# Patient Record
Sex: Female | Born: 1970 | Race: White | Hispanic: No | Marital: Married | Smoking: Never smoker
Health system: Southern US, Community
[De-identification: ages and names within clinical notes are randomized; demographics above are authoritative.]

## PROBLEM LIST (undated history)

## (undated) DIAGNOSIS — G43909 Migraine, unspecified, not intractable, without status migrainosus: Secondary | ICD-10-CM

## (undated) DIAGNOSIS — F419 Anxiety disorder, unspecified: Secondary | ICD-10-CM

## (undated) DIAGNOSIS — E039 Hypothyroidism, unspecified: Secondary | ICD-10-CM

## (undated) DIAGNOSIS — F988 Other specified behavioral and emotional disorders with onset usually occurring in childhood and adolescence: Secondary | ICD-10-CM

## (undated) DIAGNOSIS — J45909 Unspecified asthma, uncomplicated: Secondary | ICD-10-CM

## (undated) HISTORY — DX: Hypothyroidism, unspecified: E03.9

## (undated) HISTORY — DX: Other specified behavioral and emotional disorders with onset usually occurring in childhood and adolescence: F98.8

## (undated) HISTORY — DX: Anxiety disorder, unspecified: F41.9

## (undated) HISTORY — DX: Migraine, unspecified, not intractable, without status migrainosus: G43.909

## (undated) HISTORY — DX: Unspecified asthma, uncomplicated: J45.909

## (undated) HISTORY — PX: BREAST CYST ASPIRATION: SHX578

---

## 2000-07-05 ENCOUNTER — Other Ambulatory Visit: Admission: RE | Admit: 2000-07-05 | Discharge: 2000-07-05 | Payer: Self-pay | Admitting: Obstetrics and Gynecology

## 2000-07-29 ENCOUNTER — Emergency Department (HOSPITAL_COMMUNITY): Admission: EM | Admit: 2000-07-29 | Discharge: 2000-07-30 | Payer: Self-pay | Admitting: Emergency Medicine

## 2000-07-30 ENCOUNTER — Encounter: Payer: Self-pay | Admitting: Emergency Medicine

## 2000-07-30 ENCOUNTER — Ambulatory Visit (HOSPITAL_COMMUNITY): Admission: RE | Admit: 2000-07-30 | Discharge: 2000-07-30 | Payer: Self-pay | Admitting: Emergency Medicine

## 2001-07-24 ENCOUNTER — Inpatient Hospital Stay (HOSPITAL_COMMUNITY): Admission: AD | Admit: 2001-07-24 | Discharge: 2001-07-24 | Payer: Self-pay | Admitting: Obstetrics and Gynecology

## 2001-07-24 ENCOUNTER — Encounter: Payer: Self-pay | Admitting: Obstetrics and Gynecology

## 2001-07-28 ENCOUNTER — Inpatient Hospital Stay (HOSPITAL_COMMUNITY): Admission: AD | Admit: 2001-07-28 | Discharge: 2001-07-28 | Payer: Self-pay | Admitting: Obstetrics and Gynecology

## 2001-07-29 ENCOUNTER — Inpatient Hospital Stay (HOSPITAL_COMMUNITY): Admission: AD | Admit: 2001-07-29 | Discharge: 2001-07-31 | Payer: Self-pay | Admitting: Obstetrics and Gynecology

## 2001-09-03 ENCOUNTER — Other Ambulatory Visit: Admission: RE | Admit: 2001-09-03 | Discharge: 2001-09-03 | Payer: Self-pay | Admitting: Obstetrics and Gynecology

## 2002-09-27 ENCOUNTER — Emergency Department (HOSPITAL_COMMUNITY): Admission: EM | Admit: 2002-09-27 | Discharge: 2002-09-27 | Payer: Self-pay | Admitting: Emergency Medicine

## 2002-10-04 ENCOUNTER — Other Ambulatory Visit: Admission: RE | Admit: 2002-10-04 | Discharge: 2002-10-04 | Payer: Self-pay | Admitting: Obstetrics and Gynecology

## 2003-06-06 ENCOUNTER — Encounter: Admission: RE | Admit: 2003-06-06 | Discharge: 2003-06-06 | Payer: Self-pay | Admitting: Internal Medicine

## 2003-06-06 ENCOUNTER — Encounter: Payer: Self-pay | Admitting: Internal Medicine

## 2003-10-10 ENCOUNTER — Other Ambulatory Visit: Admission: RE | Admit: 2003-10-10 | Discharge: 2003-10-10 | Payer: Self-pay | Admitting: Obstetrics and Gynecology

## 2004-04-05 ENCOUNTER — Emergency Department (HOSPITAL_COMMUNITY): Admission: EM | Admit: 2004-04-05 | Discharge: 2004-04-05 | Payer: Self-pay | Admitting: Family Medicine

## 2004-11-05 ENCOUNTER — Other Ambulatory Visit: Admission: RE | Admit: 2004-11-05 | Discharge: 2004-11-05 | Payer: Self-pay | Admitting: Obstetrics and Gynecology

## 2005-12-23 ENCOUNTER — Other Ambulatory Visit: Admission: RE | Admit: 2005-12-23 | Discharge: 2005-12-23 | Payer: Self-pay | Admitting: Obstetrics and Gynecology

## 2006-01-27 ENCOUNTER — Encounter: Admission: RE | Admit: 2006-01-27 | Discharge: 2006-01-27 | Payer: Self-pay | Admitting: Family Medicine

## 2012-08-03 ENCOUNTER — Other Ambulatory Visit: Payer: Self-pay | Admitting: Obstetrics and Gynecology

## 2012-08-03 DIAGNOSIS — N6009 Solitary cyst of unspecified breast: Secondary | ICD-10-CM

## 2012-08-10 ENCOUNTER — Ambulatory Visit
Admission: RE | Admit: 2012-08-10 | Discharge: 2012-08-10 | Disposition: A | Discharge status: Home / Self Care | Payer: BC Managed Care – PPO | Source: Ambulatory Visit | Attending: Obstetrics and Gynecology | Admitting: Obstetrics and Gynecology

## 2012-08-10 DIAGNOSIS — N6009 Solitary cyst of unspecified breast: Secondary | ICD-10-CM

## 2013-12-10 ENCOUNTER — Other Ambulatory Visit: Payer: Self-pay

## 2013-12-10 DIAGNOSIS — Z1231 Encounter for screening mammogram for malignant neoplasm of breast: Secondary | ICD-10-CM

## 2013-12-17 ENCOUNTER — Ambulatory Visit
Admission: RE | Admit: 2013-12-17 | Discharge: 2013-12-17 | Disposition: A | Discharge status: Home / Self Care | Payer: BC Managed Care – PPO | Source: Ambulatory Visit

## 2013-12-17 DIAGNOSIS — Z1231 Encounter for screening mammogram for malignant neoplasm of breast: Secondary | ICD-10-CM

## 2016-03-25 ENCOUNTER — Ambulatory Visit
Admission: RE | Admit: 2016-03-25 | Discharge: 2016-03-25 | Disposition: A | Discharge status: Home / Self Care | Payer: Self-pay | Source: Ambulatory Visit | Attending: Family Medicine | Admitting: Family Medicine

## 2016-03-25 ENCOUNTER — Other Ambulatory Visit: Payer: Self-pay | Admitting: Family Medicine

## 2016-03-25 DIAGNOSIS — J45909 Unspecified asthma, uncomplicated: Secondary | ICD-10-CM

## 2016-08-02 ENCOUNTER — Other Ambulatory Visit: Payer: Self-pay | Admitting: Family Medicine

## 2016-08-02 DIAGNOSIS — E049 Nontoxic goiter, unspecified: Secondary | ICD-10-CM

## 2016-08-08 ENCOUNTER — Ambulatory Visit
Admission: RE | Admit: 2016-08-08 | Discharge: 2016-08-08 | Disposition: A | Discharge status: Home / Self Care | Payer: 59 | Source: Ambulatory Visit | Attending: Family Medicine | Admitting: Family Medicine

## 2016-08-08 DIAGNOSIS — E049 Nontoxic goiter, unspecified: Secondary | ICD-10-CM

## 2016-08-09 ENCOUNTER — Other Ambulatory Visit: Payer: BLUE CROSS/BLUE SHIELD

## 2018-11-15 ENCOUNTER — Other Ambulatory Visit: Payer: Self-pay | Admitting: Family Medicine

## 2018-11-15 ENCOUNTER — Ambulatory Visit (INDEPENDENT_AMBULATORY_CARE_PROVIDER_SITE_OTHER): Payer: 59

## 2018-11-15 DIAGNOSIS — R002 Palpitations: Secondary | ICD-10-CM

## 2018-12-14 ENCOUNTER — Ambulatory Visit: Payer: 59 | Admitting: Internal Medicine

## 2019-01-15 ENCOUNTER — Telehealth: Payer: Self-pay | Admitting: Internal Medicine

## 2019-01-15 NOTE — Telephone Encounter (Signed)
Mychart, smartphone, pre reg complete 01/15/19 AF °

## 2019-01-16 ENCOUNTER — Telehealth (INDEPENDENT_AMBULATORY_CARE_PROVIDER_SITE_OTHER): Payer: 59 | Admitting: Internal Medicine

## 2019-01-16 ENCOUNTER — Ambulatory Visit: Payer: 59 | Admitting: Internal Medicine

## 2019-01-16 ENCOUNTER — Encounter: Payer: Self-pay | Admitting: Internal Medicine

## 2019-01-16 ENCOUNTER — Telehealth: Payer: Self-pay

## 2019-01-16 VITALS — BP 115/86 | HR 89 | Ht 66.0 in | Wt 232.0 lb

## 2019-01-16 DIAGNOSIS — F988 Other specified behavioral and emotional disorders with onset usually occurring in childhood and adolescence: Secondary | ICD-10-CM | POA: Insufficient documentation

## 2019-01-16 DIAGNOSIS — E039 Hypothyroidism, unspecified: Secondary | ICD-10-CM | POA: Insufficient documentation

## 2019-01-16 DIAGNOSIS — J45909 Unspecified asthma, uncomplicated: Secondary | ICD-10-CM

## 2019-01-16 DIAGNOSIS — R002 Palpitations: Secondary | ICD-10-CM | POA: Diagnosis not present

## 2019-01-16 DIAGNOSIS — I491 Atrial premature depolarization: Secondary | ICD-10-CM

## 2019-01-16 DIAGNOSIS — F419 Anxiety disorder, unspecified: Secondary | ICD-10-CM | POA: Insufficient documentation

## 2019-01-16 DIAGNOSIS — G43909 Migraine, unspecified, not intractable, without status migrainosus: Secondary | ICD-10-CM | POA: Insufficient documentation

## 2019-01-16 MED ORDER — DILTIAZEM HCL ER COATED BEADS 120 MG PO CP24: 120.0000 mg | ORAL_CAPSULE | Freq: Every day | ORAL | 1 refills | Status: DC

## 2019-01-16 NOTE — Telephone Encounter (Signed)
Virtual Visit Pre-Appointment Phone Call  "(Name), I am calling you today to discuss your upcoming appointment. We are currently trying to limit exposure to the virus that causes COVID-19 by seeing patients at home rather than in the office."  1. "What is the BEST phone number to call the day of the visit?" - include this in appointment notes  2. "Do you have or have access to (through a family member/friend) a smartphone with video capability that we can use for your visit?" a. If yes - list this number in appt notes as "cell" (if different from BEST phone #) and list the appointment type as a VIDEO visit in appointment notes b. If no - list the appointment type as a PHONE visit in appointment notes  3. Confirm consent - "In the setting of the current Covid19 crisis, you are scheduled for a VIDEO visit with Dr. Jacques NavyAcharya on 01/16/2019 at 1:20PM.  Just as we do with many in-office visits, in order for you to participate in this visit, we must obtain consent.  If you'd like, I can send this to your mychart (if signed up) or email for you to review.  Otherwise, I can obtain your verbal consent now.  All virtual visits are billed to your insurance company just like a normal visit would be.  By agreeing to a virtual visit, we'd like you to understand that the technology does not allow for your provider to perform an examination, and thus may limit your provider's ability to fully assess your condition. If your provider identifies any concerns that need to be evaluated in person, we will make arrangements to do so.  Finally, though the technology is pretty good, we cannot assure that it will always work on either your or our end, and in the setting of a video visit, we may have to convert it to a phone-only visit.  In either situation, we cannot ensure that we have a secure connection.  Are you willing to proceed?" STAFF: Did the patient verbally acknowledge consent to telehealth visit? Document YES/NO here:  YES  4. Advise patient to be prepared - "Two hours prior to your appointment, go ahead and check your blood pressure, pulse, oxygen saturation, and your weight (if you have the equipment to check those) and write them all down. When your visit starts, your provider will ask you for this information. If you have an Apple Watch or Kardia device, please plan to have heart rate information ready on the day of your appointment. Please have a pen and paper handy nearby the day of the visit as well."  5. Give patient instructions for MyChart download to smartphone OR Doximity/Doxy.me as below if video visit (depending on what platform provider is using)  6. Inform patient they will receive a phone call 15 minutes prior to their appointment time (may be from unknown caller ID) so they should be prepared to answer    TELEPHONE CALL NOTE  Lynnell B Dant has been deemed a candidate for a follow-up tele-health visit to limit community exposure during the Covid-19 pandemic. I spoke with the patient via phone to ensure availability of phone/video source, confirm preferred email & phone number, and discuss instructions and expectations.  I reminded Lottie RaterChristy B Fenner to be prepared with any vital sign and/or heart rhythm information that could potentially be obtained via home monitoring, at the time of her visit. I reminded Lottie RaterChristy B Noguera to expect a phone call prior to her visit.  Dorris Fetch, CMA 01/16/2019 2:33 PM   INSTRUCTIONS FOR DOWNLOADING THE MYCHART APP TO SMARTPHONE  - The patient must first make sure to have activated MyChart and know their login information - If Apple, go to [employer] and type in MyChart in the search bar and download the app. If Android, ask patient to go to [employer] and type in [facility] in the search bar and download the app. The app is free but as with any other app downloads, their phone may require them to verify saved payment information or Apple/Android  password.  - The patient will need to then log into the app with their MyChart username and password, and select [facility] as their healthcare provider to link the account. When it is time for your visit, go to the MyChart app, find appointments, and click Begin Video Visit. Be sure to Select Allow for your device to access the Microphone and Camera for your visit. You will then be connected, and your provider will be with you shortly.  **If they have any issues connecting, or need assistance please contact MyChart service desk [phone])**  **If using a computer, in order to ensure the best quality for their visit they will need to use either of the following Internet Browsers: [employer] or Google Chrome**  IF USING DOXIMITY or DOXY.ME - The patient will receive a link just prior to their visit by text.     FULL LENGTH CONSENT FOR TELE-HEALTH VISIT   I hereby voluntarily request, consent and authorize [facility] and its employed or contracted physicians, physician assistants, nurse practitioners or other licensed health care professionals (the Practitioner), to provide me with telemedicine health care services (the "Services") as deemed necessary by the treating Practitioner. I acknowledge and consent to receive the Services by the Practitioner via telemedicine. I understand that the telemedicine visit will involve communicating with the Practitioner through live audiovisual communication technology and the disclosure of certain medical information by electronic transmission. I acknowledge that I have been given the opportunity to request an in-person assessment or other available alternative prior to the telemedicine visit and am voluntarily participating in the telemedicine visit.  I understand that I have the right to withhold or withdraw my consent to the use of telemedicine in the course of my care at any time, without affecting my right to future care or treatment,  and that the Practitioner or I may terminate the telemedicine visit at any time. I understand that I have the right to inspect all information obtained and/or recorded in the course of the telemedicine visit and may receive copies of available information for a reasonable fee.  I understand that some of the potential risks of receiving the Services via telemedicine include:  Marland Kitchen Delay or interruption in medical evaluation due to technological equipment failure or disruption; . Information transmitted may not be sufficient (e.g. poor resolution of images) to allow for appropriate medical decision making by the Practitioner; and/or  . In rare instances, security protocols could fail, causing a breach of personal health information.  Furthermore, I acknowledge that it is my responsibility to provide information about my medical history, conditions and care that is complete and accurate to the best of my ability. I acknowledge that Practitioner's advice, recommendations, and/or decision may be based on factors not within their control, such as incomplete or inaccurate data provided by me or distortions of diagnostic images or specimens that may result from electronic transmissions. I understand that the  practice of medicine is not an Chief Strategy Officer and that Practitioner makes no warranties or guarantees regarding treatment outcomes. I acknowledge that I will receive a copy of this consent concurrently upon execution via email to the email address I last provided but may also request a printed copy by calling the office of [facility].    I understand that my insurance will be billed for this visit.   I have read or had this consent read to me. . I understand the contents of this consent, which adequately explains the benefits and risks of the Services being provided via telemedicine.  . I have been provided ample opportunity to ask questions regarding this consent and the Services and have had my questions  answered to my satisfaction. . I give my informed consent for the services to be provided through the use of telemedicine in my medical care  By participating in this telemedicine visit I agree to the above.

## 2019-01-16 NOTE — Progress Notes (Signed)
Virtual Visit via Video Note   This visit type was conducted due to national recommendations for restrictions regarding the COVID-19 Pandemic (e.g. social distancing) in an effort to limit this patient's exposure and mitigate transmission in our community.  Due to her co-morbid illnesses, this patient is at least at moderate risk for complications without adequate follow up.  This format is felt to be most appropriate for this patient at this time.  All issues noted in this document were discussed and addressed.  A limited physical exam was performed with this format.  Please refer to the patient's chart for her consent to telehealth for [facility].   Evaluation Performed:  Follow-up visit  Date:  01/16/2019   ID:  Renee Mays, DOB July 21, 1971, MRN [id number]  Patient Location: Home Provider Location: Home  PCP:  Maurice Small, MD  Cardiologist:  Parke Poisson, MD  Electrophysiologist:  None   Chief Complaint:  Palpitations  History of Present Illness:    Renee Mays is a 48 y.o. female with a past medical history of migraines, ADD, and asthma with recent worsening who presents today for evaluation of palpitations which have been documented as PACs.  She tells me that her symptoms started in February and feels like a racing heart.  She feels this sensation is different than her previously experienced panic attacks and feels more like a flipping in her chest that can occur without exertion.  She had an ECG performed which documented PACs and has also had a Holter monitor showing the same, however she tells me that she did not experience these concerning symptoms during her Holter monitor.  Since February she has avoided caffeine and has not used her albuterol inhaler this season, despite feeling as if her asthma symptoms are worse.  She feels that she needs to use her long-acting medications sooner than when they are due, and has noticed wheezing.  She does not follow with  an asthma specialist, and her asthma is primarily managed by Dr. Valentina Lucks.  She uses Advair and Singulair for asthma.  The day after she wore the Holter monitor she had an episode of palpitations for 5 or 6 hours.  Now she notices that her symptoms occur 3-4 times a week and can occur for 2 to 3 hours at a time, remitting on their own.  Reducing her caffeine intake has helped.  She continues to take Vyvanse mostly Monday through Friday to help with her focus at work.  We have talked in detail about how this may be exacerbating palpitations as a stimulant medication.  She takes the Vyvanse at 830 or 9 AM each day and notices her palpitations began at 2 PM and reliably occur in the afternoon.  The patient denies chest pain, chest pressure, PND, orthopnea, or leg swelling. Denies syncope or presyncope. Denies dizziness or lightheadedness. Denies snoring.  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  No known COVID exposures.  She has taken propranolol in the past for migraines and tolerated this medication well despite her history of asthma.  She is currently on Synthroid and has not had a recent dose change, last TSH was normal.  She stays active with walking and yard work and has no exertional chest pain or exertional palpitations. Her palpitations do make her fearful to exercise and use her rescue albuterol inhaler.  Hx of 1 pregnancy, no gestational DM or HTN, no pre-eclampsia.  No family history of SCD or early MI.   The patient does not have symptoms concerning for COVID-19 infection (fever, chills, cough, or new shortness of breath).    Past Medical History:  Diagnosis Date  . ADD (attention deficit disorder)   . Anxiety   . Asthma   . Hypothyroid   . Migraine    History reviewed. No pertinent surgical history.   Current Meds  Medication Sig  . albuterol (VENTOLIN HFA) 108 (90 Base) MCG/ACT inhaler Use as directed as  needed  . escitalopram (LEXAPRO) 10 MG tablet Take 10 mg by mouth daily.  . Fluticasone-Salmeterol (ADVAIR DISKUS) 100-50 MCG/DOSE AEPB Use as directed as needed for seasonal asthma  . levonorgestrel (MIRENA) 20 MCG/24HR IUD As directed  . levothyroxine (SYNTHROID) 50 MCG tablet TAKE 1 TAB 5 TIMES A WEEK, AND 2 TABS 2 TIMES A WEEK ON EMPTY STOMACH FOR THYROID.  Marland Kitchen. lisdexamfetamine (VYVANSE) 30 MG capsule Take as directed (mainly weekdays)  . montelukast (SINGULAIR) 10 MG tablet Take 1 tablet once a day as needed for allergies  . SUMAtriptan (IMITREX) 100 MG tablet Use as directed as needed for migraines     Allergies:   Sulfamethoxazole   Social History   Tobacco Use  . Smoking status: Never Smoker  . Smokeless tobacco: Never Used  Substance Use Topics  . Alcohol use: Not on file  . Drug use: Not on file     Family Hx: The patient's family history includes Atrial fibrillation in her father; Heart disease in her maternal grandfather.  ROS:   Please see the history of present illness.     All other systems reviewed and are negative.   Prior CV studies:   The following studies were reviewed today:  Holter - 11/15/2018 ECG - 10/27/2018  Labs/Other Tests and Data Reviewed:    EKG:  An ECG dated 10/27/2018 was personally reviewed today and demonstrated:  sinus rhythm, PACs, rate 80 bpm.  Recent Labs: No results found for requested labs within last 8760 hours.   Recent Lipid Panel No results found for: CHOL, TRIG, HDL, CHOLHDL, LDLCALC, LDLDIRECT  Wt Readings from Last 3 Encounters:  01/16/19 232 lb (105.2 kg)     Objective:    Vital Signs:  BP 115/86   Pulse 89   Ht 5\' 6"  (1.676 m)   Wt 232 lb (105.2 kg)   BMI 37.45 kg/m    VITAL SIGNS:  reviewed GEN:  no acute distress EYES:  sclerae anicteric, EOMI - Extraocular Movements Intact RESPIRATORY:  normal respiratory effort, symmetric expansion CARDIOVASCULAR:  no peripheral edema SKIN:  no rash, lesions or ulcers.  MUSCULOSKELETAL:  no obvious deformities. NEURO:  alert and oriented x 3, no obvious focal deficit PSYCH:  normal affect  ASSESSMENT & PLAN:    1. Palpitations   2. PAC (premature atrial contraction)   3. Anxiety   4. Asthma   5. Hypothyroidism, unspecified type    Her palpitations are occurring several times per week, but when captured appear to be PACs.  It would be reasonable to consider an extended monitor for her symptoms since she feels that they may not have been fully captured on the Holter monitor.  This is non-urgent and can be done in 6 to 8 weeks.  We will also plan for an echocardiogram around that time to ensure there is no structural cause of palpitations.  I have offered the patient medical therapy for treatment of palpitations symptoms.  With her subjective  worsening of asthma symptoms, I would like to avoid beta-blockade in the acute setting, though as this can be considered in the future if she has more stable sounding symptoms.  She has tolerated propranolol well in the past for migraines.  Instead we will attempt to use calcium channel blockers, and will start diltiazem long-acting 120 mg daily.  I have instructed the patient that this can be taken at anytime during the day that suits her symptoms.  I have instructed her to contact the office if she has lightheadedness, dizziness, presyncope or syncope and we will revisit her medical therapy.  I have reviewed this medication with our in office pharmacist, and this seems like a reasonable initiation dose.  She is to take her blood pressure and pulse daily and call us if there are any concerning aberrations.  COVID-19 Education: The signs and symptoms of COVID-19 were discussed with the patient and how to seek care for testing (follow up with PCP or arrange E-visit).  The importance of social distancing was discussed today.  Time:   Today, I have spent 33 minutes with the patient with telehealth technology discussing the above  problems.   Medication Adjustments/Labs and Tests Ordered: Current medicines are reviewed at length with the patient today.  Concerns regarding medicines are outlined above.   Tests Ordered: No orders of the defined types were placed in this encounter.   Medication Changes: Meds ordered this encounter  Medications  . diltiazem (CARDIZEM CD) 120 MG 24 hr capsule    Sig: Take 1 capsule (120 mg total) by mouth daily.    Dispense:  90 capsule    Refill:  1    Disposition:  Follow up in 2 month(s)  Signed, Parke Poisson, MD  01/16/2019 4:59 PM    [facility]  Medication Instructions:   START DILTIAZEM (Cardizem) 120 Mg daily  If you need a refill on your cardiac medications before your next appointment, please call your pharmacy.   Lab work:  NONE ordered at this time of appointment   If you have labs (blood work) drawn today and your tests are completely normal, you will receive your results only by: Marland Kitchen MyChart Message (if you have MyChart) OR . A paper copy in the mail If you have any lab test that is abnormal or we need to change your treatment, we will call you to review the results.  Testing/Procedures:  NONE ordered at this time of appointment   Follow-Up: At [facility], you and your health needs are our priority.  As part of our continuing mission to provide you with exceptional heart care, we have created designated Provider Care Teams.  These Care Teams include your primary Cardiologist (physician) and Advanced Practice Providers (APPs -  Physician Assistants and Nurse Practitioners) who all work together to provide you with the care you need, when you need it. . You will need a follow up appointment in 2 months with Parke Poisson, MD   Any Other Special Instructions Will Be Listed Below (If Applicable).   Monitor you bloop pressure (BP) and heart rate (HR) daily  If your the Systolic (top) number of your blood pressure is 100  mmHg or less and your heart rate at rest is 50 bpm or less please give our office a call  If you are experiencing any lightheadedness and/or dizziness please give our office a call

## 2019-01-16 NOTE — Patient Instructions (Addendum)
Medication Instructions:   START DILTIAZEM (Cardizem) 120 Mg daily  If you need a refill on your cardiac medications before your next appointment, please call your pharmacy.   Lab work:  NONE ordered at this time of appointment   If you have labs (blood work) drawn today and your tests are completely normal, you will receive your results only by: Marland Kitchen MyChart Message (if you have MyChart) OR . A paper copy in the mail If you have any lab test that is abnormal or we need to change your treatment, we will call you to review the results.  Testing/Procedures:  NONE ordered at this time of appointment   Follow-Up: At [facility], you and your health needs are our priority.  As part of our continuing mission to provide you with exceptional heart care, we have created designated Provider Care Teams.  These Care Teams include your primary Cardiologist (physician) and Advanced Practice Providers (APPs -  Physician Assistants and Nurse Practitioners) who all work together to provide you with the care you need, when you need it. . You will need a follow up appointment in 2 months with Parke Poisson, MD   Any Other Special Instructions Will Be Listed Below (If Applicable).   Monitor you bloop pressure (BP) and heart rate (HR) daily  If your the Systolic (top) number of your blood pressure is 100 or less and your heart rate at rest is 50 or less please give our office a call  If you are experiencing any lightheadedness and/or dizziness please give our office a call

## 2019-03-14 ENCOUNTER — Telehealth: Payer: Self-pay | Admitting: Internal Medicine

## 2019-03-14 NOTE — Telephone Encounter (Signed)
Mychart, smartphone, consent, pre reg complete 03/14/19 AF °

## 2019-03-20 ENCOUNTER — Telehealth (INDEPENDENT_AMBULATORY_CARE_PROVIDER_SITE_OTHER): Payer: 59 | Admitting: Internal Medicine

## 2019-03-20 VITALS — BP 112/78 | HR 88 | Ht 66.0 in | Wt 230.0 lb

## 2019-03-20 DIAGNOSIS — E039 Hypothyroidism, unspecified: Secondary | ICD-10-CM

## 2019-03-20 DIAGNOSIS — I491 Atrial premature depolarization: Secondary | ICD-10-CM | POA: Diagnosis not present

## 2019-03-20 DIAGNOSIS — J45909 Unspecified asthma, uncomplicated: Secondary | ICD-10-CM | POA: Diagnosis not present

## 2019-03-20 DIAGNOSIS — R002 Palpitations: Secondary | ICD-10-CM | POA: Diagnosis not present

## 2019-03-20 DIAGNOSIS — F419 Anxiety disorder, unspecified: Secondary | ICD-10-CM | POA: Diagnosis not present

## 2019-03-20 NOTE — Patient Instructions (Addendum)
Medication Instructions:  Continue diltiazem at current dose 120 mg daily.   If you need a refill on your cardiac medications before your next appointment, please call your pharmacy.   Lab work: NONE   Testing/Procedures: Your physician has requested that you have an echocardiogram. Echocardiography is a painless test that uses sound waves to create images of your heart. It provides your doctor with information about the size and shape of your heart and how well your heart's chambers and valves are working. This procedure takes approximately one hour. There are no restrictions for this procedure. [facility] AT [address]  Follow-Up: At [facility], you and your health needs are our priority.  As part of our continuing mission to provide you with exceptional heart care, we have created designated Provider Care Teams.  These Care Teams include your primary Cardiologist (physician) and Advanced Practice Providers (APPs -  Physician Assistants and Nurse Practitioners) who all work together to provide you with the care you need, when you need it. You will need a follow up appointment in 6 months.  Please call our office 2 months in advance to schedule this appointment.  You may see Parke PoissonGayatri A Briannon Boggio, MD or one of the following Advanced Practice Providers on your designated Care Team:   Corine ShelterLuke Kilroy, PA-C Judy PimpleKrista Kroeger, [place] JerseyPA-C . Marjie Skiffallie Goodrich, PA-C  Any Other Special Instructions Will Be Listed Below (If Applicable).  Echocardiogram An echocardiogram is a procedure that uses painless sound waves (ultrasound) to produce an image of the heart. Images from an echocardiogram can provide important information about:  Signs of coronary artery disease (CAD).  Aneurysm detection. An aneurysm is a weak or damaged part of an artery wall that bulges out from the normal force of blood pumping through the body.  Heart size and shape. Changes in the size or shape of the heart can be  associated with certain conditions, including heart failure, aneurysm, and CAD.  Heart muscle function.  Heart valve function.  Signs of a past heart attack.  Fluid buildup around the heart.  Thickening of the heart muscle.  A tumor or infectious growth around the heart valves. Tell a health care provider about:  Any allergies you have.  All medicines you are taking, including vitamins, herbs, eye drops, creams, and over-the-counter medicines.  Any blood disorders you have.  Any surgeries you have had.  Any medical conditions you have.  Whether you are pregnant or may be pregnant. What are the risks? Generally, this is a safe procedure. However, problems may occur, including:  Allergic reaction to dye (contrast) that may be used during the procedure. What happens before the procedure? No specific preparation is needed. You may eat and drink normally. What happens during the procedure?   An IV tube may be inserted into one of your veins.  You may receive contrast through this tube. A contrast is an injection that improves the quality of the pictures from your heart.  A gel will be applied to your chest.  A wand-like tool (transducer) will be moved over your chest. The gel will help to transmit the sound waves from the transducer.  The sound waves will harmlessly bounce off of your heart to allow the heart images to be captured in real-time motion. The images will be recorded on a computer. The procedure may vary among health care providers and hospitals. What happens after the procedure?  You may return to your normal, everyday life, including diet,  activities, and medicines, unless your health care provider tells you not to do that. Summary  An echocardiogram is a procedure that uses painless sound waves (ultrasound) to produce an image of the heart.  Images from an echocardiogram can provide important information about the size and shape of your heart, heart  muscle function, heart valve function, and fluid buildup around your heart.  You do not need to do anything to prepare before this procedure. You may eat and drink normally.  After the echocardiogram is completed, you may return to your normal, everyday life, unless your health care provider tells you not to do that. This information is not intended to replace advice given to you by your health care provider. Make sure you discuss any questions you have with your health care provider. Document Released: 09/09/2000 Document Revised: 10/15/2016 Document Reviewed: 10/15/2016 Elsevier Interactive Patient Education  2019 [employer].

## 2019-03-20 NOTE — Progress Notes (Signed)
Virtual Visit via Telephone Note   This visit type was conducted due to national recommendations for restrictions regarding the COVID-19 Pandemic (e.g. social distancing) in an effort to limit this patient's exposure and mitigate transmission in our community.  Due to her co-morbid illnesses, this patient is at least at moderate risk for complications without adequate follow up.  This format is felt to be most appropriate for this patient at this time.  The patient did not have access to video technology/had technical difficulties with video requiring transitioning to audio format only (telephone).  All issues noted in this document were discussed and addressed.  No physical exam could be performed with this format.  Please refer to the patient's chart for her  consent to telehealth for [facility].   Date:  03/20/2019   ID:  Renee Mays, DOB Feb 28, 1971, MRN [id number]  Patient Location: Home Provider Location: Office  PCP:  Maurice SmallGriffin, Elaine, MD  Cardiologist:  Parke PoissonGayatri A Erasmus Bistline, MD  Electrophysiologist:  None   Evaluation Performed:  Follow-Up Visit  Chief Complaint:  palpitations  History of Present Illness:    Renee RaterChristy B Mays is a 48 y.o. female with a past medical history of migraines, ADD, and asthma, who presents for follow up of palpitations.   She saw an allergist after our last visit, and switched to symbicort, had significant allergies.Asthma therapy is helping improve her symptoms. Palpitations 2 days a week now - better from almost daily before. Vyvanse does not seem to affect. Control of asthma symptoms has been most helpful.   The patient denies chest pain, chest pressure, PND, orthopnea, or leg swelling. Denies syncope or presyncope. Denies dizziness or lightheadedness.  Tolerating diltiazem well. No hypotension or bradycardia. Reviewed holter monitor which showed PACs.   The patient does not have symptoms concerning for COVID-19 infection (fever, chills, cough,  or new shortness of breath).    Past Medical History:  Diagnosis Date  . ADD (attention deficit disorder)   . Anxiety   . Asthma   . Hypothyroid   . Migraine    No past surgical history on file.   Current Meds  Medication Sig  . albuterol (VENTOLIN HFA) 108 (90 Base) MCG/ACT inhaler Use as directed as needed  . diltiazem (CARDIZEM CD) 120 MG 24 hr capsule Take 1 capsule (120 mg total) by mouth daily.  Marland Kitchen. escitalopram (LEXAPRO) 10 MG tablet Take 10 mg by mouth daily.  . Fluticasone-Salmeterol (ADVAIR DISKUS) 100-50 MCG/DOSE AEPB Use as directed as needed for seasonal asthma  . levonorgestrel (MIRENA) 20 MCG/24HR IUD As directed  . levothyroxine (SYNTHROID) 50 MCG tablet TAKE 1 TAB 5 TIMES A WEEK, AND 2 TABS 2 TIMES A WEEK ON EMPTY STOMACH FOR THYROID.  Marland Kitchen. lisdexamfetamine (VYVANSE) 30 MG capsule Take as directed (mainly weekdays)  . montelukast (SINGULAIR) 10 MG tablet Take 1 tablet once a day as needed for allergies  . SUMAtriptan (IMITREX) 100 MG tablet Use as directed as needed for migraines  . SYMBICORT 80-4.5 MCG/ACT inhaler INHALE 2 PFFS INTO THE LUNGS TWICE DAILY     Allergies:   Sulfamethoxazole   Social History   Tobacco Use  . Smoking status: Never Smoker  . Smokeless tobacco: Never Used  Substance Use Topics  . Alcohol use: Not on file  . Drug use: Not on file     Family Hx: The patient's family history includes Atrial fibrillation in her father; Heart disease in her maternal grandfather.  ROS:   Please see  the history of present illness.     All other systems reviewed and are negative.   Prior CV studies:   The following studies were reviewed today:    Labs/Other Tests and Data Reviewed:    EKG:  No ECG reviewed.  Recent Labs: No results found for requested labs within last 8760 hours.   Recent Lipid Panel No results found for: CHOL, TRIG, HDL, CHOLHDL, LDLCALC, LDLDIRECT  Wt Readings from Last 3 Encounters:  03/20/19 230 lb (104.3 kg)   01/16/19 232 lb (105.2 kg)     Objective:    Vital Signs:  BP 112/78   Pulse 88   Ht 5\' 6"  (1.676 m)   Wt 230 lb (104.3 kg)   BMI 37.12 kg/m    VITAL SIGNS:  reviewed GEN:  no acute distress  ASSESSMENT & PLAN:    1. Palpitations   2. PAC (premature atrial contraction)   3. Anxiety   4. Asthma   5. Hypothyroidism, unspecified type    Palpitations - doing well on diltiazem and adjusted asthma therapy. Will obtain an echocardiogram to ensure there are no structural changes to the heart contributing to palpitations which may not have been captured on 24 hr holter.   Asthma - per allergy.   Lifestyle modification and CV prevention - discussed. Recommendations below: Exercise recommendations: Goal of exercising for at least 30 minutes a day, at least 5 times per week.  Please exercise to a moderate exertion.  This means that while exercising it is difficult to speak in full sentences, however you are not so short of breath that you feel you must stop, and not so comfortable that you can carry on a full conversation.  Exertion level should be approximately a 5/10, if 10 is the most exertion you can perform.  Diet recommendations: Recommend a heart healthy diet such as the Mediterranean diet.  This diet consists of plant based foods, healthy fats, lean meats, olive oil.  It suggests limiting the intake of simple carbohydrates such as white breads, pastries, and pastas.  It also limits the amount of red meat, wine, and dairy products such as cheese that one should consume on a daily basis.   COVID-19 Education: The signs and symptoms of COVID-19 were discussed with the patient and how to seek care for testing (follow up with PCP or arrange E-visit).  The importance of social distancing was discussed today.  Time:   Today, I have spent 20 minutes with the patient with telehealth technology discussing the above problems.     Medication Adjustments/Labs and Tests Ordered: Current  medicines are reviewed at length with the patient today.  Concerns regarding medicines are outlined above.   Tests Ordered: Orders Placed This Encounter  Procedures  . ECHOCARDIOGRAM COMPLETE    Medication Changes: No orders of the defined types were placed in this encounter.   Follow Up:  Virtual Visit or In Person in 6 month(s)  Signed, Elouise Munroe, MD  03/20/2019 9:47 PM    [facility]

## 2019-04-01 ENCOUNTER — Other Ambulatory Visit (HOSPITAL_COMMUNITY): Payer: 59

## 2019-04-04 ENCOUNTER — Other Ambulatory Visit: Payer: Self-pay

## 2019-04-04 ENCOUNTER — Ambulatory Visit (HOSPITAL_COMMUNITY): Payer: 59 | Attending: Cardiovascular Disease

## 2019-04-04 DIAGNOSIS — R002 Palpitations: Secondary | ICD-10-CM | POA: Diagnosis present

## 2019-05-21 NOTE — Telephone Encounter (Signed)
Returned call to pt she states that she is having increased HR and PAC's multiple times daily and states that she does not take her BP but HR on her apple watch is 105-134. Scheduled appt 05-27-2019 for evaluation

## 2019-05-21 NOTE — Telephone Encounter (Signed)
Renee Mays - I agree with patient that she should come in for an in-office visit so we can check an EKG. I dont have any openings on my schedule tomorrow - can you check the other APP's schedules to see if anyone has an appointment this week or early next week? In the meantime, as long as her blood pressures have been >110/70, would favor increasing her diltiazem to 240mg  daily for better HR control.   Thanks!

## 2019-05-26 NOTE — Progress Notes (Signed)
Cardiology Office Note:   Date:  05/27/2019  NAME:  Renee Mays    MRN: [id number] DOB:  October 14, 1970   PCP:  Kelton Pillar, MD  Cardiologist:  Elouise Munroe, MD  Electrophysiologist:  None   Referring MD: Kelton Pillar, MD   Chief Complaint  Patient presents with  . Palpitations   History of Present Illness:   Renee Mays is a 48 y.o. female with a hx of asthma, anxiety, palpitations who is being seen today for the evaluation of palpitations at the request of Kelton Pillar, MD. she presents for evaluation of worsening palpitations, and shortness of breath with exertion.  She reports for the past 2 months she is noticed worsening sensation of tachycardia and shortness of breath with activity.  It appears this started out of the blue.  She reports no associated chest pain or pressure.  She was recently started on diltiazem in March for frequent PACs.  She reports she notices them less with Cardizem, and things were were better.  However it appears, she has developed new symptoms of worsening exertional tachycardia.  She monitors her heart rate and states that it can get up to the 140-150 range.  She reports this sensation of rapid heartbeat with exercise causes her significant stress.  She also reports a significant history of anxiety and stress.  She takes several medications for this.  She also does report a period of inactivity due to the recent coronavirus pandemic, and it appears her symptoms have also coincided with reinitiation of more strenuous activity.  Overall, she does report a lack of energy.  Of note, she did not take her diltiazem this morning and her heart rate is 108 EKG demonstrates sinus tachycardia.  She reports occasional lower extremity swelling, after being in the sun for several hours.  She also reports hot flashes and symptoms concerning for menopause, however GYN reports she has not there yet.   Past Medical History: Past Medical History:  Diagnosis Date   . ADD (attention deficit disorder)   . Anxiety   . Asthma   . Hypothyroid   . Migraine     Past Surgical History: No past surgical history on file.  Current Medications: Current Meds  Medication Sig  . albuterol (VENTOLIN HFA) 108 (90 Base) MCG/ACT inhaler Use as directed as needed  . diltiazem (CARDIZEM CD) 120 MG 24 hr capsule Take 1 capsule (120 mg total) by mouth daily.  Marland Kitchen escitalopram (LEXAPRO) 10 MG tablet Take 10 mg by mouth daily.  Marland Kitchen levonorgestrel (MIRENA) 20 MCG/24HR IUD As directed  . levothyroxine (SYNTHROID) 50 MCG tablet TAKE 1 TAB 5 TIMES A WEEK, AND 2 TABS 2 TIMES A WEEK ON EMPTY STOMACH FOR THYROID.  Marland Kitchen lisdexamfetamine (VYVANSE) 30 MG capsule Take as directed (mainly weekdays)  . montelukast (SINGULAIR) 10 MG tablet Take 1 tablet once a day as needed for allergies  . SUMAtriptan (IMITREX) 100 MG tablet Use as directed as needed for migraines  . SYMBICORT 80-4.5 MCG/ACT inhaler INHALE 2 PFFS INTO THE LUNGS TWICE DAILY     Allergies:    Sulfamethoxazole   Social History: Social History   Socioeconomic History  . Marital status: Married    Spouse name: Not on file  . Number of children: Not on file  . Years of education: Not on file  . Highest education level: Not on file  Occupational History  . Not on file  Social Needs  . Financial resource strain: Not on file  .  Food insecurity    Worry: Not on file    Inability: Not on file  . Transportation needs    Medical: Not on file    Non-medical: Not on file  Tobacco Use  . Smoking status: Never Smoker  . Smokeless tobacco: Never Used  Substance and Sexual Activity  . Alcohol use: Not on file  . Drug use: Not on file  . Sexual activity: Not on file  Lifestyle  . Physical activity    Days per week: Not on file    Minutes per session: Not on file  . Stress: Not on file  Relationships  . Social Musicianconnections    Talks on phone: Not on file    Gets together: Not on file    Attends religious service:  Not on file    Active member of club or organization: Not on file    Attends meetings of clubs or organizations: Not on file    Relationship status: Not on file  Other Topics Concern  . Not on file  Social History Narrative  . Not on file     Family History: The patient's family history includes Atrial fibrillation in her father; Heart disease in her maternal grandfather.  ROS:   All other ROS reviewed and negative. Pertinent positives noted in the HPI.     EKGs/Labs/Other Studies Reviewed:   The following studies were personally reviewed by me today: TSH 10/2018 1.92  Review of recent 24 monitor demonstrates occasional PACs  EKG:  EKG is ordered today.  The ekg ordered today demonstrates sinus tachycardia, heart rate 108, normal intervals, no acute ST-T changes, no prior infarction, and was personally reviewed by me.   Recent Labs: No results found for requested labs within last 8760 hours.   Recent Lipid Panel No results found for: CHOL, TRIG, HDL, CHOLHDL, VLDL, LDLCALC, LDLDIRECT  Physical Exam:   VS:  BP 102/70   Pulse (!) 113   Temp 98.2 F (36.8 C)   Ht 5\' 6"  (1.676 m)   Wt 246 lb (111.6 kg)   SpO2 99%   BMI 39.71 kg/m    Wt Readings from Last 3 Encounters:  05/27/19 246 lb (111.6 kg)  03/20/19 230 lb (104.3 kg)  01/16/19 232 lb (105.2 kg)    General: Well nourished, well developed, in no acute distress Heart: Atraumatic, normal size  Eyes: PEERLA, EOMI  Neck: Supple, no JVD Endocrine: No thryomegaly Cardiac: Normal S1, S2; RRR; no murmurs, rubs, or gallops Lungs: Clear to auscultation bilaterally, no wheezing, rhonchi or rales  Abd: Soft, nontender, no hepatomegaly  Ext: No edema, pulses 2+ Musculoskeletal: No deformities, BUE and BLE strength normal and equal Skin: Warm and dry, no rashes   Neuro: Alert and oriented to person, place, time, and situation, CNII-XII grossly intact, no focal deficits  Psych: Normal mood and affect   ASSESSMENT:   NAME@  is a 48 y.o. female who presents for the following: 1. Palpitations   2. PAC (premature atrial contraction)   3. Sinus tachycardia   4. Pre-procedure lab exam     PLAN:   1. Palpitations 2. PAC (premature atrial contraction) 3. Sinus tachycardia -She has a known history of symptomatic PACs and is taking Cardizem.  It appears recently she is developed exertional symptoms of palpitations and shortness of breath.  A recent echocardiogram demonstrates normal LV function and normal diastolic function.  It appears her symptoms are always related to heavy exertion.  EKG today demonstrates sinus tachycardia  with a rate of 108.  I think her symptoms could be related to deconditioning, and/or intermittent rebound tachycardia from her Cardizem.  I think the simplest approach to determine what is going on when she exercises is to do a plain exercise treadmill test.  She reports she is in agreement to take this test.  I think it will be beneficial determine what occurs when she does exert herself. -Most recent thyroid studies were normal -I have encouraged her to continue to exercise and exert herself as she is able to in the interim -She will need a coronavirus test prior to her exercise treadmill  4. Pre-procedure lab exam -[place] test prior to exercise test   Disposition: Return in about 3 months (around 08/26/2019).  Medication Adjustments/Labs and Tests Ordered: Current medicines are reviewed at length with the patient today.  Concerns regarding medicines are outlined above.  Orders Placed This Encounter  Procedures  . Novel Coronavirus, NAA ([facility])  . Exercise Tolerance Test  . EKG 12-Lead   No orders of the defined types were placed in this encounter.   Patient Instructions  Medication Instructions:  Continue same medications If you need a refill on your cardiac medications before your next appointment, please call your pharmacy.   Lab work: None ordered  Testing/Procedures:  Schedule Treadmill  ( POET )  Follow-Up: At [facility], you and your health needs are our priority.  As part of our continuing mission to provide you with exceptional heart care, we have created designated Provider Care Teams.  These Care Teams include your primary Cardiologist (physician) and Advanced Practice Providers (APPs -  Physician Assistants and Nurse Practitioners) who all work together to provide you with the care you need, when you need it. . Schedule follow up appointment with Dr.Acharya in 3 months      Signed, Gerri Spore T. Flora Lipps, MD [facility]  [address] [place], [place] [address] [phone]  05/27/2019 12:11 PM

## 2019-05-27 ENCOUNTER — Encounter: Payer: Self-pay | Admitting: Cardiovascular Disease

## 2019-05-27 ENCOUNTER — Ambulatory Visit: Payer: 59 | Admitting: Cardiovascular Disease

## 2019-05-27 ENCOUNTER — Other Ambulatory Visit: Payer: Self-pay

## 2019-05-27 VITALS — BP 102/70 | HR 113 | Temp 98.2°F | Ht 66.0 in | Wt 246.0 lb

## 2019-05-27 DIAGNOSIS — I491 Atrial premature depolarization: Secondary | ICD-10-CM | POA: Diagnosis not present

## 2019-05-27 DIAGNOSIS — R002 Palpitations: Secondary | ICD-10-CM | POA: Diagnosis not present

## 2019-05-27 DIAGNOSIS — R000 Tachycardia, unspecified: Secondary | ICD-10-CM | POA: Diagnosis not present

## 2019-05-27 DIAGNOSIS — Z01812 Encounter for preprocedural laboratory examination: Secondary | ICD-10-CM | POA: Diagnosis not present

## 2019-05-27 NOTE — Patient Instructions (Signed)
Medication Instructions:  Continue same medications If you need a refill on your cardiac medications before your next appointment, please call your pharmacy.   Lab work: None ordered  Testing/Procedures: Schedule Treadmill  ( POET )  Follow-Up: At [facility], you and your health needs are our priority.  As part of our continuing mission to provide you with exceptional heart care, we have created designated Provider Care Teams.  These Care Teams include your primary Cardiologist (physician) and Advanced Practice Providers (APPs -  Physician Assistants and Nurse Practitioners) who all work together to provide you with the care you need, when you need it. . Schedule follow up appointment with Dr.Acharya in 3 months

## 2019-06-07 ENCOUNTER — Telehealth (HOSPITAL_COMMUNITY): Payer: Self-pay | Admitting: *Deleted

## 2019-06-07 NOTE — Telephone Encounter (Signed)
Close encounter 

## 2019-06-08 ENCOUNTER — Other Ambulatory Visit (HOSPITAL_COMMUNITY)
Admission: RE | Admit: 2019-06-08 | Discharge: 2019-06-08 | Disposition: A | Discharge status: Home / Self Care | Payer: 59 | Source: Ambulatory Visit | Attending: Cardiovascular Disease | Admitting: Cardiovascular Disease

## 2019-06-08 DIAGNOSIS — Z20828 Contact with and (suspected) exposure to other viral communicable diseases: Secondary | ICD-10-CM | POA: Diagnosis not present

## 2019-06-08 DIAGNOSIS — Z01812 Encounter for preprocedural laboratory examination: Secondary | ICD-10-CM | POA: Insufficient documentation

## 2019-06-09 LAB — NOVEL CORONAVIRUS, NAA (HOSP ORDER, SEND-OUT TO REF LAB; TAT 18-24 HRS): SARS-CoV-2, NAA: NOT DETECTED

## 2019-06-12 ENCOUNTER — Other Ambulatory Visit: Payer: Self-pay

## 2019-06-12 ENCOUNTER — Ambulatory Visit (HOSPITAL_COMMUNITY)
Admission: RE | Admit: 2019-06-12 | Discharge: 2019-06-12 | Disposition: A | Discharge status: Home / Self Care | Payer: 59 | Source: Ambulatory Visit | Attending: Cardiovascular Disease | Admitting: Cardiovascular Disease

## 2019-06-12 DIAGNOSIS — I491 Atrial premature depolarization: Secondary | ICD-10-CM

## 2019-06-12 DIAGNOSIS — R002 Palpitations: Secondary | ICD-10-CM

## 2019-06-12 DIAGNOSIS — R000 Tachycardia, unspecified: Secondary | ICD-10-CM

## 2019-06-12 LAB — EXERCISE TOLERANCE TEST
Estimated workload: 9.4 METS
Exercise duration (min): 7 min
Exercise duration (sec): 36 s
MPHR: 175 {beats}/min
Peak HR: 169 {beats}/min
Percent HR: 96 %
Rest HR: 100 {beats}/min

## 2019-06-18 ENCOUNTER — Other Ambulatory Visit: Payer: Self-pay | Admitting: Obstetrics and Gynecology

## 2019-06-18 DIAGNOSIS — R928 Other abnormal and inconclusive findings on diagnostic imaging of breast: Secondary | ICD-10-CM

## 2019-06-19 ENCOUNTER — Ambulatory Visit
Admission: RE | Admit: 2019-06-19 | Discharge: 2019-06-19 | Disposition: A | Discharge status: Home / Self Care | Payer: 59 | Source: Ambulatory Visit | Attending: Obstetrics and Gynecology | Admitting: Obstetrics and Gynecology

## 2019-06-19 ENCOUNTER — Other Ambulatory Visit: Payer: Self-pay

## 2019-06-19 DIAGNOSIS — R928 Other abnormal and inconclusive findings on diagnostic imaging of breast: Secondary | ICD-10-CM

## 2019-06-24 ENCOUNTER — Other Ambulatory Visit: Payer: Self-pay | Admitting: Internal Medicine

## 2019-07-08 DIAGNOSIS — Z0000 Encounter for general adult medical examination without abnormal findings: Secondary | ICD-10-CM | POA: Diagnosis not present

## 2019-07-08 DIAGNOSIS — J45909 Unspecified asthma, uncomplicated: Secondary | ICD-10-CM | POA: Diagnosis not present

## 2019-07-08 DIAGNOSIS — Z13220 Encounter for screening for lipoid disorders: Secondary | ICD-10-CM | POA: Diagnosis not present

## 2019-07-08 DIAGNOSIS — K219 Gastro-esophageal reflux disease without esophagitis: Secondary | ICD-10-CM | POA: Diagnosis not present

## 2019-07-08 DIAGNOSIS — Z23 Encounter for immunization: Secondary | ICD-10-CM | POA: Diagnosis not present

## 2019-07-08 DIAGNOSIS — J309 Allergic rhinitis, unspecified: Secondary | ICD-10-CM | POA: Diagnosis not present

## 2019-07-26 DIAGNOSIS — Z79899 Other long term (current) drug therapy: Secondary | ICD-10-CM | POA: Diagnosis not present

## 2019-07-26 DIAGNOSIS — F902 Attention-deficit hyperactivity disorder, combined type: Secondary | ICD-10-CM | POA: Diagnosis not present

## 2019-07-26 DIAGNOSIS — F419 Anxiety disorder, unspecified: Secondary | ICD-10-CM | POA: Diagnosis not present

## 2019-08-27 DIAGNOSIS — D2239 Melanocytic nevi of other parts of face: Secondary | ICD-10-CM | POA: Diagnosis not present

## 2019-08-27 DIAGNOSIS — Z85828 Personal history of other malignant neoplasm of skin: Secondary | ICD-10-CM | POA: Diagnosis not present

## 2019-08-27 DIAGNOSIS — D485 Neoplasm of uncertain behavior of skin: Secondary | ICD-10-CM | POA: Diagnosis not present

## 2019-08-27 DIAGNOSIS — L720 Epidermal cyst: Secondary | ICD-10-CM | POA: Diagnosis not present

## 2019-08-27 DIAGNOSIS — D2261 Melanocytic nevi of right upper limb, including shoulder: Secondary | ICD-10-CM | POA: Diagnosis not present

## 2019-08-27 DIAGNOSIS — D225 Melanocytic nevi of trunk: Secondary | ICD-10-CM | POA: Diagnosis not present

## 2019-09-02 ENCOUNTER — Encounter: Payer: Self-pay | Admitting: Internal Medicine

## 2019-09-02 ENCOUNTER — Ambulatory Visit: Payer: BC Managed Care – PPO | Admitting: Internal Medicine

## 2019-09-02 ENCOUNTER — Other Ambulatory Visit: Payer: Self-pay

## 2019-09-02 VITALS — BP 96/64 | HR 91 | Temp 97.2°F | Ht 67.0 in | Wt 242.0 lb

## 2019-09-02 DIAGNOSIS — R002 Palpitations: Secondary | ICD-10-CM | POA: Diagnosis not present

## 2019-09-02 DIAGNOSIS — J45909 Unspecified asthma, uncomplicated: Secondary | ICD-10-CM | POA: Diagnosis not present

## 2019-09-02 MED ORDER — DILTIAZEM HCL ER COATED BEADS 120 MG PO CP24: 120.0000 mg | ORAL_CAPSULE | Freq: Every day | ORAL | 3 refills | Status: AC

## 2019-09-02 NOTE — Progress Notes (Signed)
Cardiology Office Note:    Date:  09/02/2019   ID:  Renee Mays, DOB 06/02/1971, MRN [id number]  PCP:  Maurice Small, MD  Cardiologist:  Parke Poisson, MD  Electrophysiologist:  None   Referring MD: Maurice Small, MD   Chief Complaint: f/u palpitations.  History of Present Illness:    Renee Mays is a 48 y.o. female with a hx of migraines, ADD, and asthma, who presents for follow up of palpitations. She continues to have palpitations a few times a week but it will now only last for few minutes.  It will occur approximately 2 times a week.  She denies any chest pain or shortness of breath.  She feels that after being transitioned from advair to symbicort, and continuing on singulair she is doing much better.   The patient denies chest pain, chest pressure, dyspnea at rest or with exertion, PND, orthopnea, or leg swelling. Denies syncope or presyncope. Denies dizziness or lightheadedness. Denies snoring and has not been evaluated for sleep apnea.  Past Medical History:  Diagnosis Date  . ADD (attention deficit disorder)   . Anxiety   . Asthma   . Hypothyroid   . Migraine     Past Surgical History:  Procedure Laterality Date  . BREAST CYST ASPIRATION Right 6+ yrs ago    Current Medications: Current Meds  Medication Sig  . diltiazem (CARDIZEM CD) 120 MG 24 hr capsule Take 1 capsule (120 mg total) by mouth daily.  Marland Kitchen escitalopram (LEXAPRO) 10 MG tablet Take 10 mg by mouth daily.  . Fluticasone-Salmeterol (ADVAIR DISKUS) 100-50 MCG/DOSE AEPB Use as directed as needed for seasonal asthma  . levonorgestrel (MIRENA) 20 MCG/24HR IUD As directed  . levothyroxine (SYNTHROID) 50 MCG tablet TAKE 1 TAB 5 TIMES A WEEK, AND 2 TABS 2 TIMES A WEEK ON EMPTY STOMACH FOR THYROID.  Marland Kitchen lisdexamfetamine (VYVANSE) 30 MG capsule Take as directed (mainly weekdays)  . montelukast (SINGULAIR) 10 MG tablet Take 1 tablet once a day as needed for allergies  . SUMAtriptan (IMITREX) 100 MG  tablet Use as directed as needed for migraines  . SYMBICORT 80-4.5 MCG/ACT inhaler INHALE 2 PFFS INTO THE LUNGS TWICE DAILY  . [DISCONTINUED] albuterol (VENTOLIN HFA) 108 (90 Base) MCG/ACT inhaler Use as directed as needed  . [DISCONTINUED] diltiazem (CARDIZEM CD) 120 MG 24 hr capsule Take 1 capsule (120 mg total) by mouth daily.     Allergies:   Sulfamethoxazole   Social History   Socioeconomic History  . Marital status: Married    Spouse name: Not on file  . Number of children: Not on file  . Years of education: Not on file  . Highest education level: Not on file  Occupational History  . Not on file  Social Needs  . Financial resource strain: Not on file  . Food insecurity    Worry: Not on file    Inability: Not on file  . Transportation needs    Medical: Not on file    Non-medical: Not on file  Tobacco Use  . Smoking status: Never Smoker  . Smokeless tobacco: Never Used  Substance and Sexual Activity  . Alcohol use: Not on file  . Drug use: Not on file  . Sexual activity: Not on file  Lifestyle  . Physical activity    Days per week: Not on file    Minutes per session: Not on file  . Stress: Not on file  Relationships  . Social connections  Talks on phone: Not on file    Gets together: Not on file    Attends religious service: Not on file    Active member of club or organization: Not on file    Attends meetings of clubs or organizations: Not on file    Relationship status: Not on file  Other Topics Concern  . Not on file  Social History Narrative  . Not on file     Family History: The patient's family history includes Atrial fibrillation in her father; Heart disease in her maternal grandfather.  ROS:   Please see the history of present illness.    All other systems reviewed and are negative.  EKGs/Labs/Other Studies Reviewed:    The following studies were reviewed today:  EKG:  Not obtained today.   Epworth Sleepiness Scale: n/a  Recent Labs: No  results found for requested labs within last 8760 hours.  Recent Lipid Panel No results found for: CHOL, TRIG, HDL, CHOLHDL, VLDL, LDLCALC, LDLDIRECT  Physical Exam:    VS:  BP 96/64 (BP Location: Left Arm, Patient Position: Sitting, Cuff Size: Large)   Pulse 91   Temp (!) 97.2 F (36.2 C)   Ht 5\' 7"  (1.702 m)   Wt 242 lb (109.8 kg)   BMI 37.90 kg/m     Wt Readings from Last 5 Encounters:  09/02/19 242 lb (109.8 kg)  05/27/19 246 lb (111.6 kg)  03/20/19 230 lb (104.3 kg)  01/16/19 232 lb (105.2 kg)     Constitutional: No acute distress Eyes: sclera non-icteric, normal conjunctiva and lids ENMT: normal dentition, moist mucous membranes Cardiovascular: regular rhythm, normal rate, no murmurs. S1 and S2 normal. Radial pulses normal bilaterally. No jugular venous distention.  Respiratory: clear to auscultation bilaterally GI : normal bowel sounds, soft and nontender. No distention.   MSK: extremities warm, well perfused. No edema.  NEURO: grossly nonfocal exam, moves all extremities. PSYCH: alert and oriented x 3, normal mood and affect.   ASSESSMENT:    1. Palpitations   2. Asthma    PLAN:    1.  Palpitations-she feels she is doing well.  She feels that adjustments in her asthma medications have made a huge difference.  She continues on diltiazem.  We have participated in shared decision making and determined that we will continue diltiazem therapy at this time.  If she would like to stop for short period of time and evaluate her symptoms that is very reasonable.  Since she feels like things are going well after many months of difficulties, she is decided not to make a change today.  2.  Asthma -per asthma and allergy.  TIME SPENT WITH PATIENT: 15 minutes of direct patient care. More than 50% of that time was spent on coordination of care and counseling regarding palpitations, tachycardia.  Cherlynn Kaiser, MD [place]  [facility]   Medication Adjustments/Labs  and Tests Ordered: Current medicines are reviewed at length with the patient today.  Concerns regarding medicines are outlined above.  No orders of the defined types were placed in this encounter.  Meds ordered this encounter  Medications  . diltiazem (CARDIZEM CD) 120 MG 24 hr capsule    Sig: Take 1 capsule (120 mg total) by mouth daily.    Dispense:  90 capsule    Refill:  3    Patient Instructions  Medication Instructions:  Your physician recommends that you continue on your current medications as directed. Please refer to the Current Medication list given  to you today.  *If you need a refill on your cardiac medications before your next appointment, please call your pharmacy*  Lab Work: NONE If you have labs (blood work) drawn today and your tests are completely normal, you will receive your results only by: Marland Kitchen. MyChart Message (if you have MyChart) OR . A paper copy in the mail If you have any lab test that is abnormal or we need to change your treatment, we will call you to review the results.  Testing/Procedures: NONE  Follow-Up: [facility], you and your health needs are our priority.  As part of our continuing mission to provide you with exceptional heart care, we have created designated Provider Care Teams.  These Care Teams include your primary Cardiologist (physician) and Advanced Practice Providers (APPs -  Physician Assistants and Nurse Practitioners) who all work together to provide you with the care you need, when you need it.  Your next appointment:   6 month(s) You will receive a reminder letter in the mail two months in advance. If you don't receive a letter, please call our office to schedule the follow-up appointment.  The format for your next appointment:   In Person  Provider:   You may see Parke PoissonGayatri A Anastazja Isaac, MD or one of the following Advanced Practice Providers on your designated Care Team:    Theodore DemarkRhonda Barrett, PA-C  Joni ReiningKathryn Lawrence, DNP, ANP   Cadence Fransico MichaelFurth, NP

## 2019-09-02 NOTE — Patient Instructions (Signed)
Medication Instructions:  Your physician recommends that you continue on your current medications as directed. Please refer to the Current Medication list given to you today.  *If you need a refill on your cardiac medications before your next appointment, please call your pharmacy*  Lab Work: NONE If you have labs (blood work) drawn today and your tests are completely normal, you will receive your results only by: Marland Kitchen MyChart Message (if you have MyChart) OR . A paper copy in the mail If you have any lab test that is abnormal or we need to change your treatment, we will call you to review the results.  Testing/Procedures: NONE  Follow-Up: At [facility], you and your health needs are our priority.  As part of our continuing mission to provide you with exceptional heart care, we have created designated Provider Care Teams.  These Care Teams include your primary Cardiologist (physician) and Advanced Practice Providers (APPs -  Physician Assistants and Nurse Practitioners) who all work together to provide you with the care you need, when you need it.  Your next appointment:   6 month(s) You will receive a reminder letter in the mail two months in advance. If you don't receive a letter, please call our office to schedule the follow-up appointment.  The format for your next appointment:   In Person  Provider:   You may see Elouise Munroe, MD or one of the following Advanced Practice Providers on your designated Care Team:    Rosaria Ferries, PA-C  Jory Sims, DNP, ANP  Cadence Kathlen Mody, NP

## 2019-09-13 DIAGNOSIS — J301 Allergic rhinitis due to pollen: Secondary | ICD-10-CM | POA: Diagnosis not present

## 2019-09-13 DIAGNOSIS — J3081 Allergic rhinitis due to animal (cat) (dog) hair and dander: Secondary | ICD-10-CM | POA: Diagnosis not present

## 2019-09-13 DIAGNOSIS — J3089 Other allergic rhinitis: Secondary | ICD-10-CM | POA: Diagnosis not present

## 2019-09-13 DIAGNOSIS — J4540 Moderate persistent asthma, uncomplicated: Secondary | ICD-10-CM | POA: Diagnosis not present

## 2019-10-08 ENCOUNTER — Ambulatory Visit: Payer: BC Managed Care – PPO | Attending: Internal Medicine

## 2019-10-08 DIAGNOSIS — Z20822 Contact with and (suspected) exposure to covid-19: Secondary | ICD-10-CM | POA: Diagnosis not present

## 2019-10-09 LAB — NOVEL CORONAVIRUS, NAA: SARS-CoV-2, NAA: DETECTED — AB

## 2019-12-16 DIAGNOSIS — F902 Attention-deficit hyperactivity disorder, combined type: Secondary | ICD-10-CM | POA: Diagnosis not present

## 2019-12-16 DIAGNOSIS — F411 Generalized anxiety disorder: Secondary | ICD-10-CM | POA: Diagnosis not present

## 2019-12-16 DIAGNOSIS — Z79899 Other long term (current) drug therapy: Secondary | ICD-10-CM | POA: Diagnosis not present

## 2019-12-16 DIAGNOSIS — F419 Anxiety disorder, unspecified: Secondary | ICD-10-CM | POA: Diagnosis not present

## 2020-03-09 ENCOUNTER — Other Ambulatory Visit: Payer: Self-pay | Admitting: Radiology

## 2020-03-09 DIAGNOSIS — N6320 Unspecified lump in the left breast, unspecified quadrant: Secondary | ICD-10-CM | POA: Diagnosis not present

## 2020-03-11 ENCOUNTER — Ambulatory Visit
Admission: RE | Admit: 2020-03-11 | Discharge: 2020-03-11 | Disposition: A | Discharge status: Home / Self Care | Payer: BC Managed Care – PPO | Source: Ambulatory Visit | Attending: Radiology | Admitting: Radiology

## 2020-03-11 ENCOUNTER — Other Ambulatory Visit: Payer: Self-pay

## 2020-03-11 DIAGNOSIS — N6002 Solitary cyst of left breast: Secondary | ICD-10-CM | POA: Diagnosis not present

## 2020-03-11 DIAGNOSIS — N6320 Unspecified lump in the left breast, unspecified quadrant: Secondary | ICD-10-CM

## 2020-03-11 DIAGNOSIS — R922 Inconclusive mammogram: Secondary | ICD-10-CM | POA: Diagnosis not present

## 2020-03-24 DIAGNOSIS — F411 Generalized anxiety disorder: Secondary | ICD-10-CM | POA: Diagnosis not present

## 2020-03-24 DIAGNOSIS — Z79899 Other long term (current) drug therapy: Secondary | ICD-10-CM | POA: Diagnosis not present

## 2020-03-24 DIAGNOSIS — F902 Attention-deficit hyperactivity disorder, combined type: Secondary | ICD-10-CM | POA: Diagnosis not present

## 2020-03-27 DIAGNOSIS — J4530 Mild persistent asthma, uncomplicated: Secondary | ICD-10-CM | POA: Diagnosis not present

## 2020-03-27 DIAGNOSIS — J301 Allergic rhinitis due to pollen: Secondary | ICD-10-CM | POA: Diagnosis not present

## 2020-03-27 DIAGNOSIS — J3089 Other allergic rhinitis: Secondary | ICD-10-CM | POA: Diagnosis not present

## 2020-03-27 DIAGNOSIS — J3081 Allergic rhinitis due to animal (cat) (dog) hair and dander: Secondary | ICD-10-CM | POA: Diagnosis not present

## 2020-04-07 DIAGNOSIS — D2261 Melanocytic nevi of right upper limb, including shoulder: Secondary | ICD-10-CM | POA: Diagnosis not present

## 2020-04-07 DIAGNOSIS — D2239 Melanocytic nevi of other parts of face: Secondary | ICD-10-CM | POA: Diagnosis not present

## 2020-04-07 DIAGNOSIS — Z85828 Personal history of other malignant neoplasm of skin: Secondary | ICD-10-CM | POA: Diagnosis not present

## 2020-04-07 DIAGNOSIS — D225 Melanocytic nevi of trunk: Secondary | ICD-10-CM | POA: Diagnosis not present

## 2020-06-24 DIAGNOSIS — F411 Generalized anxiety disorder: Secondary | ICD-10-CM | POA: Diagnosis not present

## 2020-06-24 DIAGNOSIS — Z79899 Other long term (current) drug therapy: Secondary | ICD-10-CM | POA: Diagnosis not present

## 2020-06-24 DIAGNOSIS — F902 Attention-deficit hyperactivity disorder, combined type: Secondary | ICD-10-CM | POA: Diagnosis not present

## 2020-09-07 DIAGNOSIS — E063 Autoimmune thyroiditis: Secondary | ICD-10-CM | POA: Diagnosis not present

## 2020-09-07 DIAGNOSIS — E038 Other specified hypothyroidism: Secondary | ICD-10-CM | POA: Diagnosis not present

## 2020-09-10 ENCOUNTER — Other Ambulatory Visit: Payer: Self-pay | Admitting: Internal Medicine

## 2020-09-10 DIAGNOSIS — E038 Other specified hypothyroidism: Secondary | ICD-10-CM | POA: Diagnosis not present

## 2020-09-10 DIAGNOSIS — E063 Autoimmune thyroiditis: Secondary | ICD-10-CM | POA: Diagnosis not present

## 2020-10-29 ENCOUNTER — Other Ambulatory Visit: Payer: Self-pay | Admitting: Obstetrics and Gynecology

## 2020-10-29 DIAGNOSIS — R928 Other abnormal and inconclusive findings on diagnostic imaging of breast: Secondary | ICD-10-CM

## 2020-11-05 ENCOUNTER — Other Ambulatory Visit: Payer: Self-pay

## 2020-11-05 ENCOUNTER — Ambulatory Visit
Admission: RE | Admit: 2020-11-05 | Discharge: 2020-11-05 | Disposition: A | Discharge status: Home / Self Care | Payer: BC Managed Care – PPO | Source: Ambulatory Visit | Attending: Obstetrics and Gynecology | Admitting: Obstetrics and Gynecology

## 2020-11-05 DIAGNOSIS — R928 Other abnormal and inconclusive findings on diagnostic imaging of breast: Secondary | ICD-10-CM

## 2020-11-13 ENCOUNTER — Other Ambulatory Visit: Payer: BC Managed Care – PPO

## 2022-01-01 IMAGING — MG MM DIGITAL DIAGNOSTIC UNILAT*L* W/ TOMO W/ CAD
6 series · 6 of 18 positions shown · non-contrast
Comparison: Previous exam(s).

CLINICAL DATA: Tender mass felt by the patient in the upper outer
left breast for the past week. Previously demonstrated right breast
cysts.

EXAM:
DIGITAL DIAGNOSTIC LEFT MAMMOGRAM WITH CAD AND TOMO
ULTRASOUND LEFT BREAST

[L MLO synth-2D]
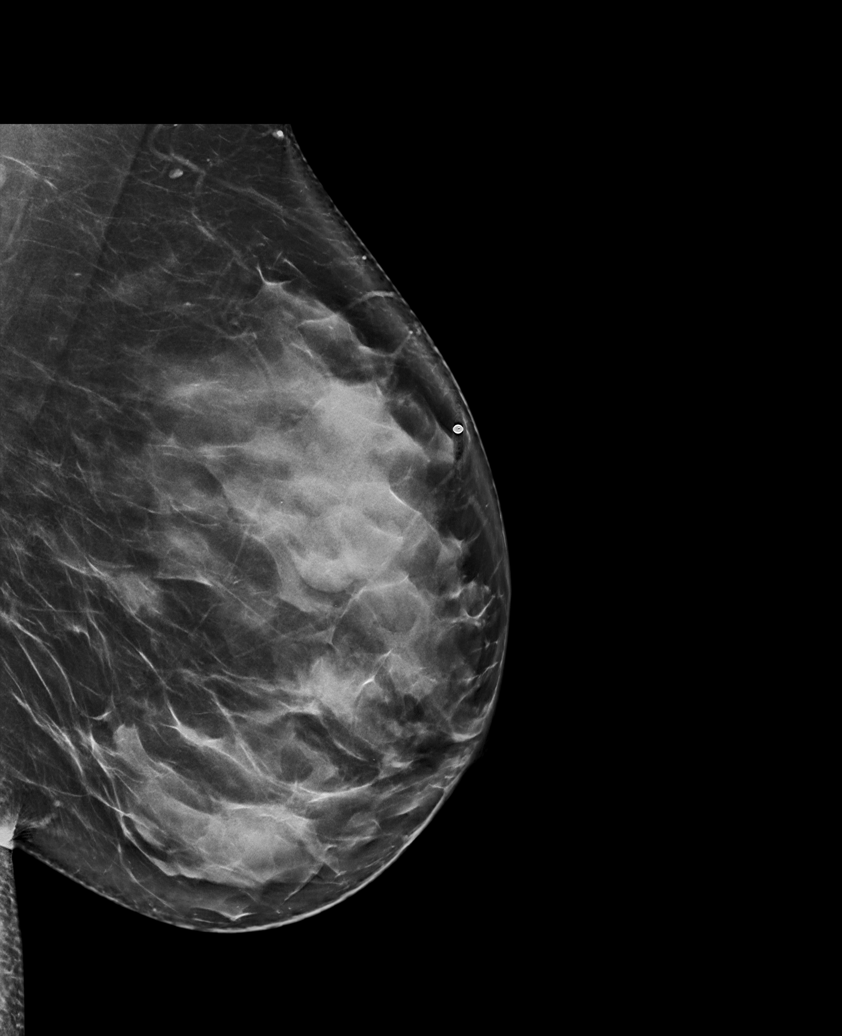

[L CC synth-2D]
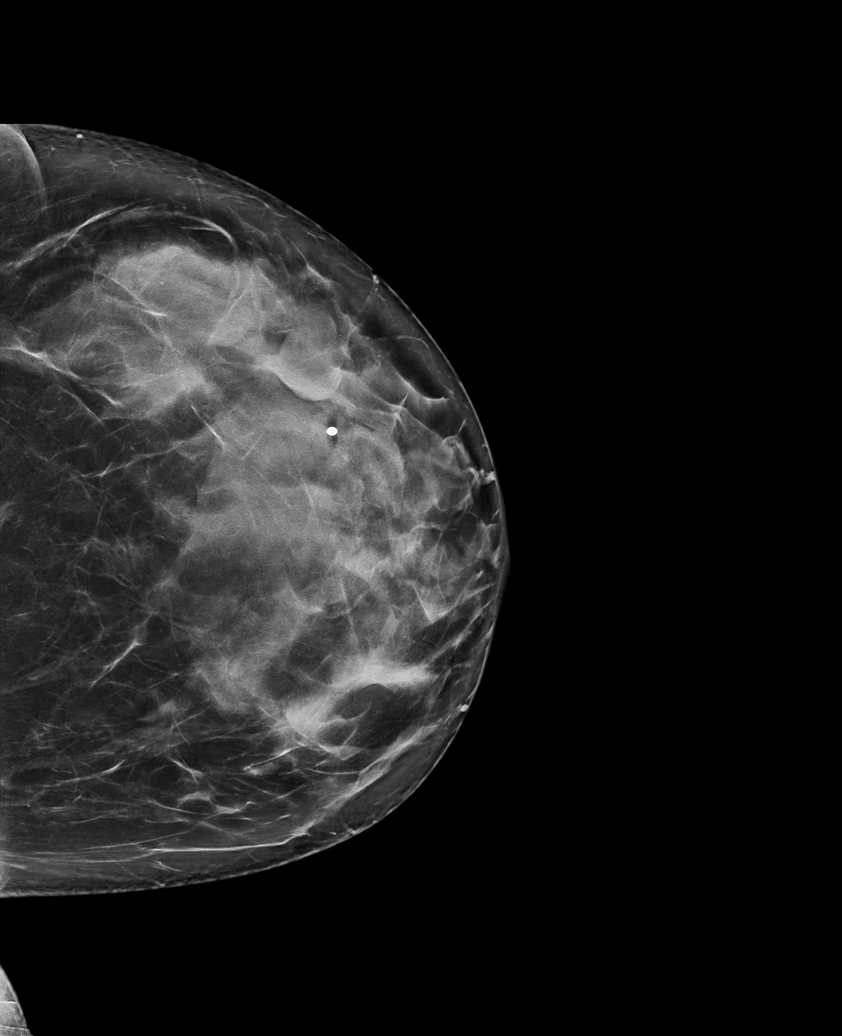

[L TAN synth-2D]
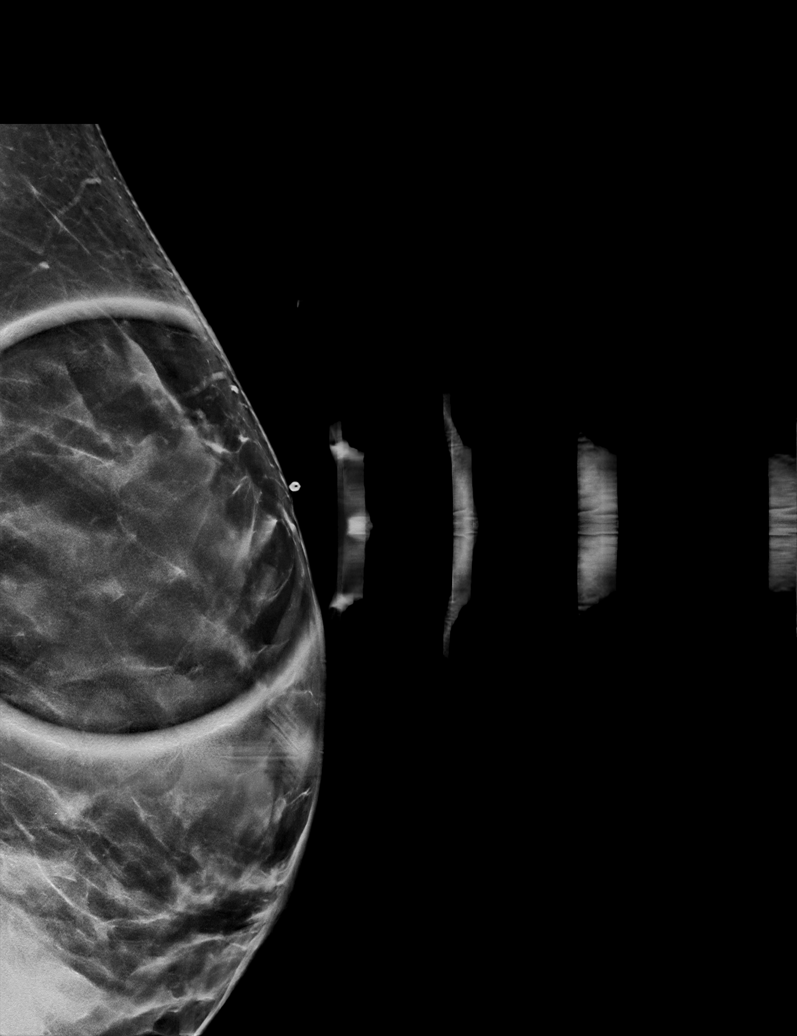

[L MLO tomo · tomo slice 48/95.0]
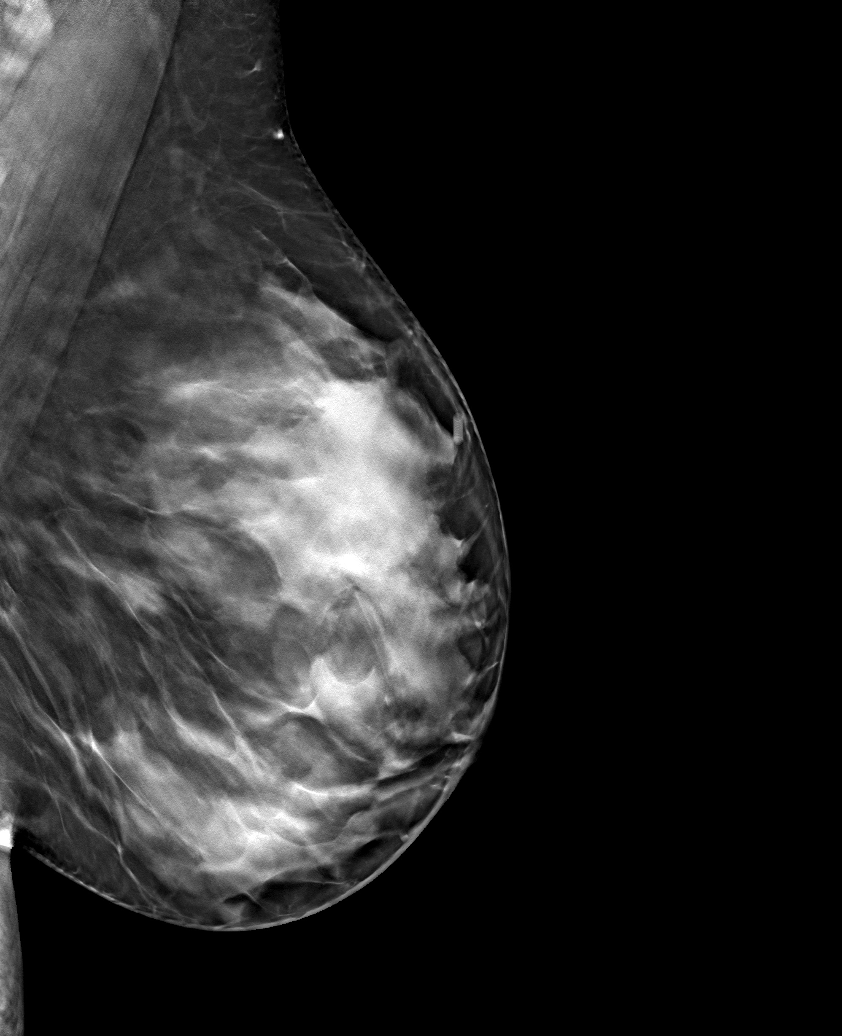

[L TAN tomo · tomo slice 38/75.0]
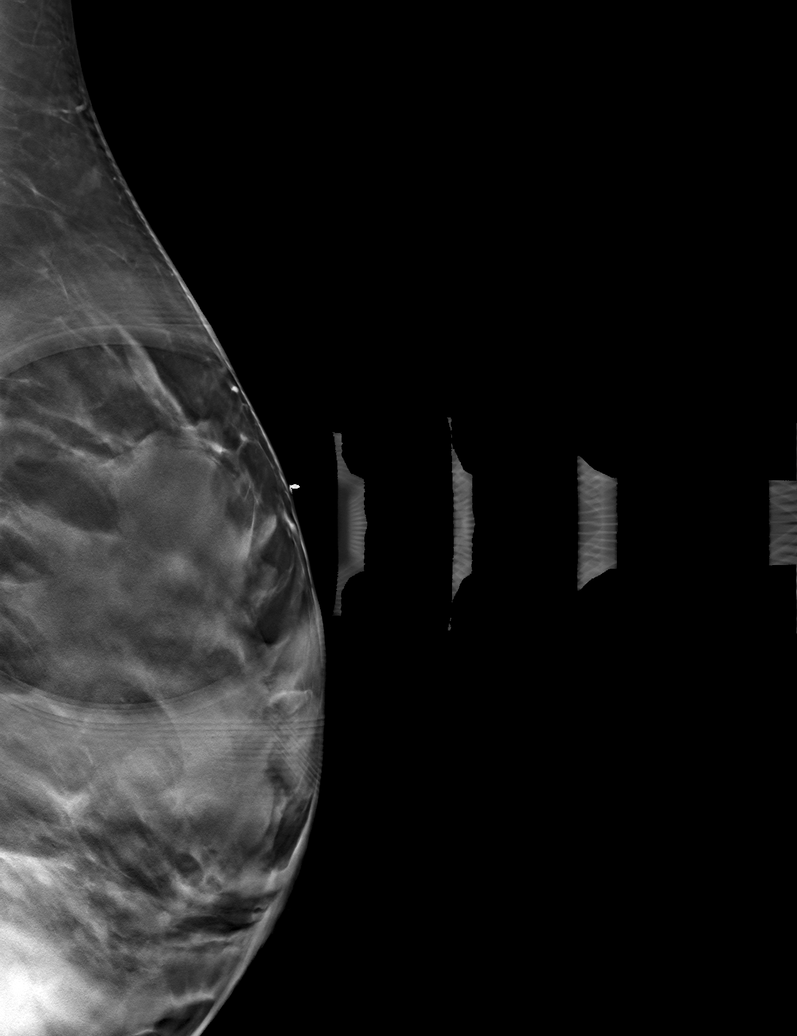

[L CC tomo · tomo slice 48/95.0]
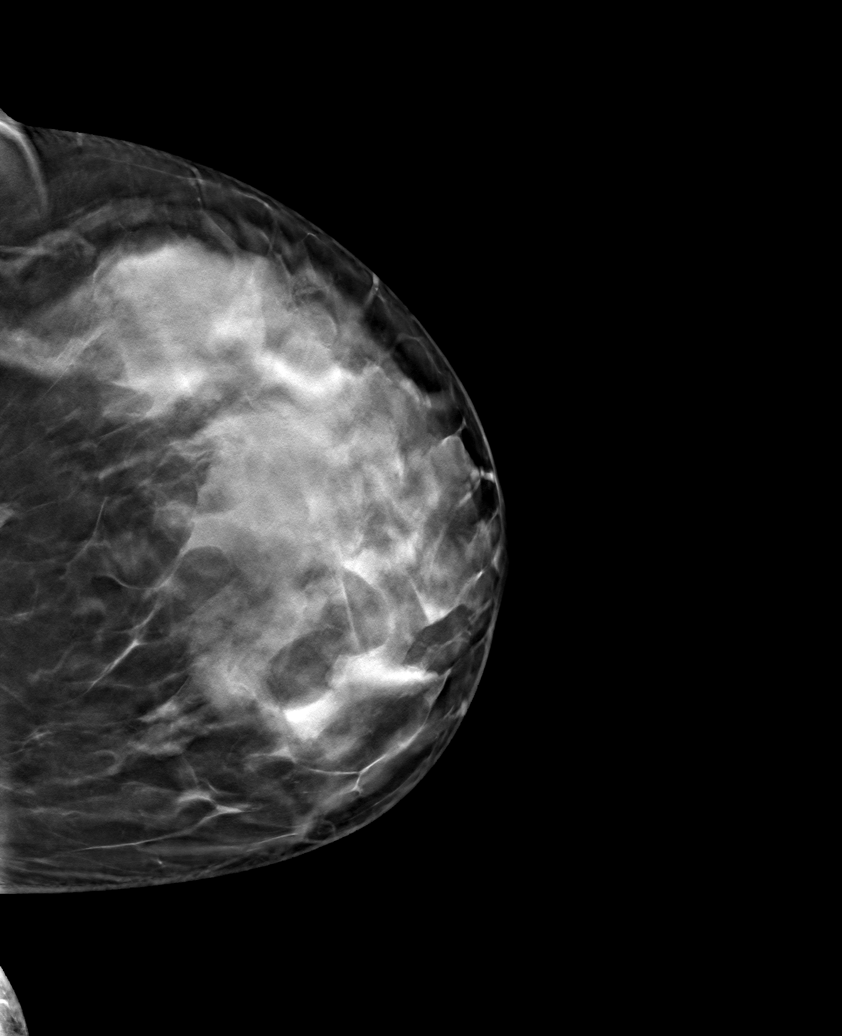

[6 of 18 positions shown; findings below may reference images not displayed]

ACR Breast Density Category d: The breast tissue is extremely dense,
which lowers the sensitivity of mammography.
FINDINGS: Multiple oval and rounded, partially circumscribed and partially
obscured masses in the left breast, including the area of palpable
concern, marked with a metallic marker.

Mammographic images were processed with CAD.

On physical exam, the patient has an approximately 3 cm oval area of
palpable thickening in the 12 to 1 o'clock position of the left
breast, 5 cm from the nipple.

Targeted ultrasound is performed, showing a 2.6 x 2.4 x 2.0 cm cyst
containing 2 thin partial internal septations in the 12 to 1 o'clock
position of the left breast, 5 cm from the nipple, corresponding to
the palpable thickening/mass.
IMPRESSION: Benign left breast cyst.  No evidence of malignancy.

RECOMMENDATION:
Bilateral screening mammogram in 3 months. That will be 1 year since
mammographic evaluation of the right breast.

I have discussed the findings and recommendations with the patient.
If applicable, a reminder letter will be sent to the patient
regarding the next appointment.

BI-RADS CATEGORY  2: Benign.

## 2024-01-02 ENCOUNTER — Other Ambulatory Visit: Payer: Self-pay | Admitting: Family

## 2024-01-02 DIAGNOSIS — N63 Unspecified lump in unspecified breast: Secondary | ICD-10-CM

## 2024-01-15 ENCOUNTER — Encounter

## 2024-01-15 ENCOUNTER — Other Ambulatory Visit

## 2024-01-22 ENCOUNTER — Ambulatory Visit
Admission: RE | Admit: 2024-01-22 | Discharge: 2024-01-22 | Disposition: A | Discharge status: Home / Self Care | Source: Ambulatory Visit | Attending: Family | Admitting: Family

## 2024-01-22 DIAGNOSIS — N63 Unspecified lump in unspecified breast: Secondary | ICD-10-CM
